# Patient Record
Sex: Male | Born: 1985 | Race: White | Hispanic: No | Marital: Single | Smoking: Current every day smoker
Health system: Southern US, Community
[De-identification: ages and names within clinical notes are randomized; demographics above are authoritative.]

---

## 2005-03-18 ENCOUNTER — Emergency Department (HOSPITAL_COMMUNITY): Admission: EM | Admit: 2005-03-18 | Discharge: 2005-03-18 | Payer: Self-pay | Admitting: *Deleted

## 2005-07-15 ENCOUNTER — Emergency Department (HOSPITAL_COMMUNITY): Admission: EM | Admit: 2005-07-15 | Discharge: 2005-07-15 | Payer: Self-pay | Admitting: Emergency Medicine

## 2005-09-08 ENCOUNTER — Emergency Department (HOSPITAL_COMMUNITY): Admission: EM | Admit: 2005-09-08 | Discharge: 2005-09-08 | Payer: Self-pay | Admitting: Emergency Medicine

## 2006-01-29 ENCOUNTER — Emergency Department (HOSPITAL_COMMUNITY): Admission: EM | Admit: 2006-01-29 | Discharge: 2006-01-29 | Payer: Self-pay | Admitting: Emergency Medicine

## 2006-03-23 ENCOUNTER — Emergency Department (HOSPITAL_COMMUNITY): Admission: EM | Admit: 2006-03-23 | Discharge: 2006-03-23 | Payer: Self-pay | Admitting: Family Medicine

## 2007-07-04 ENCOUNTER — Emergency Department (HOSPITAL_COMMUNITY): Admission: EM | Admit: 2007-07-04 | Discharge: 2007-07-04 | Payer: Self-pay | Admitting: Emergency Medicine

## 2008-01-27 ENCOUNTER — Emergency Department (HOSPITAL_COMMUNITY): Admission: EM | Admit: 2008-01-27 | Discharge: 2008-01-27 | Payer: Self-pay | Admitting: Emergency Medicine

## 2008-02-16 ENCOUNTER — Encounter: Admission: RE | Admit: 2008-02-16 | Discharge: 2008-02-16 | Payer: Self-pay | Admitting: Chiropractic Medicine
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
## 2011-08-23 LAB — I-STAT 8, (EC8 V) (CONVERTED LAB)
Acid-base deficit: 7 — ABNORMAL HIGH
Chloride: 108
HCT: 48
Operator id: 284251
Potassium: 4.3

## 2011-08-23 LAB — RAPID URINE DRUG SCREEN, HOSP PERFORMED
Barbiturates: NOT DETECTED
Benzodiazepines: NOT DETECTED
Cocaine: NOT DETECTED

## 2011-08-23 LAB — POCT I-STAT CREATININE: Creatinine, Ser: 1.4

## 2016-04-30 ENCOUNTER — Emergency Department
Admission: EM | Admit: 2016-04-30 | Discharge: 2016-04-30 | Disposition: A | Discharge status: Home / Self Care | Payer: Medicaid Other | Attending: Emergency Medicine | Admitting: Emergency Medicine

## 2016-04-30 ENCOUNTER — Encounter: Payer: Self-pay | Admitting: Emergency Medicine

## 2016-04-30 DIAGNOSIS — Y929 Unspecified place or not applicable: Secondary | ICD-10-CM | POA: Insufficient documentation

## 2016-04-30 DIAGNOSIS — T1501XA Foreign body in cornea, right eye, initial encounter: Secondary | ICD-10-CM | POA: Insufficient documentation

## 2016-04-30 DIAGNOSIS — X58XXXA Exposure to other specified factors, initial encounter: Secondary | ICD-10-CM | POA: Insufficient documentation

## 2016-04-30 DIAGNOSIS — Y999 Unspecified external cause status: Secondary | ICD-10-CM | POA: Diagnosis not present

## 2016-04-30 DIAGNOSIS — F17210 Nicotine dependence, cigarettes, uncomplicated: Secondary | ICD-10-CM | POA: Insufficient documentation

## 2016-04-30 DIAGNOSIS — Y9389 Activity, other specified: Secondary | ICD-10-CM | POA: Diagnosis not present

## 2016-04-30 DIAGNOSIS — T1591XA Foreign body on external eye, part unspecified, right eye, initial encounter: Secondary | ICD-10-CM

## 2016-04-30 DIAGNOSIS — H5711 Ocular pain, right eye: Secondary | ICD-10-CM | POA: Diagnosis present

## 2016-04-30 MED ORDER — EYE WASH OPHTH SOLN
OPHTHALMIC | Status: AC
  Filled 2016-04-30: qty 118

## 2016-04-30 MED ORDER — EYE WASH OPHTH SOLN: 1.0000 [drp] | OPHTHALMIC | Status: DC | PRN

## 2016-04-30 MED ORDER — FLUORESCEIN SODIUM 1 MG OP STRP
ORAL_STRIP | OPHTHALMIC | Status: AC
  Filled 2016-04-30: qty 1

## 2016-04-30 MED ORDER — TETANUS-DIPHTH-ACELL PERTUSSIS 5-2.5-18.5 LF-MCG/0.5 IM SUSP
0.5000 mL | Freq: Once | INTRAMUSCULAR | Status: AC
  Administered 2016-04-30: 0.5 mL via INTRAMUSCULAR
  Filled 2016-04-30: qty 0.5

## 2016-04-30 MED ORDER — TETRACAINE HCL 0.5 % OP SOLN: 1.0000 [drp] | Freq: Once | OPHTHALMIC | Status: DC

## 2016-04-30 MED ORDER — FLUORESCEIN SODIUM 1 MG OP STRP: 1.0000 | ORAL_STRIP | Freq: Once | OPHTHALMIC | Status: DC

## 2016-04-30 MED ORDER — TETRACAINE HCL 0.5 % OP SOLN
OPHTHALMIC | Status: AC
  Filled 2016-04-30: qty 2

## 2016-04-30 NOTE — Discharge Instructions (Signed)
Eye Foreign Body A foreign body is an object on or in the eye that should not be there. The object could be a speck of dirt or dust, a hair, an eyelash, a splinter, or any other object. HOME CARE  Take medicines only as told by your doctor. Use eye drops or ointment as told.  If no eye patch was put on:  Keep the eye closed as much as possible.  Do not rub the eye.  Wear dark glasses in bright light.  Do not wear contact lenses until the eye feels normal, or as told by your doctor.  Wear protective eye covering when needed, especially when using high-speed tools.  If your eye is patched:  Follow your doctor's instructions for when to remove the patch.  Do notdrive or use machines while the eye patch is on. Judging distances is hard to do while wearing a patch.  Keep all follow-up visits as told by your doctor. This is important. GET HELP IF:   Your pain gets worse.  Your vision gets worse.  You have problems with your eye patch.  You have fluid (discharge) coming from your eye.  You have redness and swelling around your eye. MAKE SURE YOU:   Understand these instructions.  Will watch your condition.  Will get help right away if you are not doing well or get worse.   This information is not intended to replace advice given to you by your health care provider. Make sure you discuss any questions you have with your health care provider.   Document Released: 04/17/2010 Document Revised: 11/18/2014 Document Reviewed: 03/25/2013 Elsevier Interactive Patient Education 2016 [employer]    Go immediately to [facility] for further treatment of your foreign body. You need to be in the office at 10:10 AM.

## 2016-04-30 NOTE — ED Provider Notes (Signed)
[facility] Emergency Department Provider Note   ____________________________________________  Time seen: Approximately 8:38 AM  I have reviewed the triage vital signs and the nursing notes.   HISTORY  Chief Complaint Eye Pain   HPI Paul Roy is a 30 y.o. male is here with complaint of medical foreign body in his right eye since chest today. Patient states that he was cutting the edge of a metal roof when he felt something go in his eye. He states he was wearing his safety glasses at the time. Since that time he has had eye pain and some redness. Patient states that both his father and his son have been using Q-tips to try and remove the metal piece from his eye. Patient is unaware if he has had a tetanus booster in the last 10 years or not.Currently he rates his pain as a 3/10.   History reviewed. No pertinent past medical history.  There are no active problems to display for this patient.   History reviewed. No pertinent past surgical history.  No current outpatient prescriptions on file.  Allergies Review of patient's allergies indicates no known allergies.  No family history on file.  Social History Social History  Substance Use Topics  . Smoking status: Current Every Day Smoker -- 1.00 packs/day    Types: Cigarettes  . Smokeless tobacco: None  . Alcohol Use: Yes     Comment: occasionally    Review of Systems Constitutional: No fever/chills Eyes: No visual changes.Positive right eye pain. ENT: No trauma Cardiovascular: Denies chest pain. Respiratory: Denies shortness of breath. Skin: Negative for rash. Neurological: Negative for headaches, focal weakness or numbness.  10-point ROS otherwise negative.  ____________________________________________   PHYSICAL EXAM:  VITAL SIGNS: ED Triage Vitals  Enc Vitals Group     BP 04/30/16 0826 107/69 mmHg     Pulse Rate 04/30/16 0826 50     Resp 04/30/16 0826 18     Temp 04/30/16  0826 97.8 F (36.6 C)     Temp Source 04/30/16 0826 Oral     SpO2 04/30/16 0826 98 %     Weight 04/30/16 0826 165 lb (74.844 kg)     Height 04/30/16 0826 5\' 11"  (1.803 m)     Head Cir --      Peak Flow --      Pain Score 04/30/16 0827 3     Pain Loc --      Pain Edu? --      Excl. in GC? --     Constitutional: Alert and oriented. Well appearing and in no acute distress. Eyes: Right eye is injected. There is a visible metallic foreign body noted without magnification seen at approximately 9:00 on the cornea. PERRL. EOMI. Head: Atraumatic. Nose: No congestion/rhinnorhea. Neck: No stridor.   Cardiovascular: Normal rate, regular rhythm. Grossly normal heart sounds.  Good peripheral circulation. Respiratory: Normal respiratory effort.  No retractions. Lungs CTAB. Gastrointestinal: Soft and nontender. No distention.  Musculoskeletal: Moves upper and lower extremities without any difficulty. Normal gait was noted. Neurologic:  Normal speech and language. No gross focal neurologic deficits are appreciated. No gait instability. Skin:  Skin is warm, dry and intact. No rash noted. Psychiatric: Mood and affect are normal. Speech and behavior are normal.  ____________________________________________   LABS (all labs ordered are listed, but only abnormal results are displayed)  Labs Reviewed - No data to display  PROCEDURES  Procedure(s) performed: Tetracaine was placed in the eye. Lid was inverted  without any foreign body noted. It was a metallic appearing foreign body present at the 9:00 position of the cornea. With a wet Q-tip foreign body was partially removed without any difficulty. Multiple attempts were made to remove the remaining portion without success. fluorescein uptake was positive in this area along with visualization of the metallic foreign body.  Critical Care performed: No  ____________________________________________   INITIAL IMPRESSION / ASSESSMENT AND PLAN / ED  COURSE  Pertinent labs & imaging results that were available during my care of the patient were reviewed by me and considered in my medical decision making (see chart for details).  Patient was given a tetanus booster. He is to leave the emergency room and go straight to [facility] for removal of the remaining foreign body. ____________________________________________   FINAL CLINICAL IMPRESSION(S) / ED DIAGNOSES  Final diagnoses:  Foreign body of right eye, initial encounter      NEW MEDICATIONS STARTED DURING THIS VISIT:  There are no discharge medications for this patient.    Note:  This document was prepared using Dragon voice recognition software and may include unintentional dictation errors.    Tommi Rumps, PA-C 04/30/16 1043

## 2016-04-30 NOTE — ED Notes (Signed)
States he thinks he got a piece of metal in right yesterday  conts to have pain denies any visula changes

## 2016-04-30 NOTE — ED Notes (Signed)
Patient presents to the ED with right eye pain.  Patient states, "I think there's a piece of metal in my eye." Patient states he was cutting the edge of a metal roof when his eye began to hurt.  Patient's right eye is slightly red.  Patient reports slight blurry vision.

## 2016-07-15 ENCOUNTER — Emergency Department
Admission: EM | Admit: 2016-07-15 | Discharge: 2016-07-15 | Disposition: A | Discharge status: Home / Self Care | Payer: Medicaid Other | Attending: Emergency Medicine | Admitting: Emergency Medicine

## 2016-07-15 ENCOUNTER — Other Ambulatory Visit
Admission: EM | Admit: 2016-07-15 | Discharge: 2016-07-15 | Disposition: A | Discharge status: Home / Self Care | Attending: Family Medicine | Admitting: Family Medicine

## 2016-07-15 DIAGNOSIS — E860 Dehydration: Secondary | ICD-10-CM | POA: Diagnosis not present

## 2016-07-15 DIAGNOSIS — F17210 Nicotine dependence, cigarettes, uncomplicated: Secondary | ICD-10-CM | POA: Insufficient documentation

## 2016-07-15 DIAGNOSIS — T40601A Poisoning by unspecified narcotics, accidental (unintentional), initial encounter: Secondary | ICD-10-CM | POA: Insufficient documentation

## 2016-07-15 LAB — BASIC METABOLIC PANEL
Anion gap: 4 — ABNORMAL LOW (ref 5–15)
BUN: 15 mg/dL (ref 6–20)
CALCIUM: 8.7 mg/dL — AB (ref 8.9–10.3)
CO2: 27 mmol/L (ref 22–32)
Chloride: 111 mmol/L (ref 101–111)
Creatinine, Ser: 1 mg/dL (ref 0.61–1.24)
GFR calc Af Amer: >60 mL/min (ref >60)
GLUCOSE: 155 mg/dL — AB (ref 65–99)
POTASSIUM: 3.3 mmol/L — AB (ref 3.5–5.1)
Sodium: 142 mmol/L (ref 135–145)

## 2016-07-15 LAB — URINE DRUG SCREEN, QUALITATIVE (ARMC ONLY)
Amphetamines, Ur Screen: NOT DETECTED
BARBITURATES, UR SCREEN: NOT DETECTED
BENZODIAZEPINE, UR SCRN: POSITIVE — AB
Cannabinoid 50 Ng, Ur ~~LOC~~: POSITIVE — AB
Cocaine Metabolite,Ur ~~LOC~~: NOT DETECTED
MDMA (Ecstasy)Ur Screen: NOT DETECTED
Methadone Scn, Ur: NOT DETECTED
OPIATE, UR SCREEN: NOT DETECTED
PHENCYCLIDINE (PCP) UR S: NOT DETECTED
Tricyclic, Ur Screen: NOT DETECTED

## 2016-07-15 LAB — CBC
HCT: 39.2 % — ABNORMAL LOW (ref 40.0–52.0)
Hemoglobin: 13.4 g/dL (ref 13.0–18.0)
MCH: 31.7 pg (ref 26.0–34.0)
MCHC: 34.2 g/dL (ref 32.0–36.0)
MCV: 92.5 fL (ref 80.0–100.0)
PLATELETS: 175 10*3/uL (ref 150–440)
RBC: 4.24 MIL/uL — ABNORMAL LOW (ref 4.40–5.90)
RDW: 12.1 % (ref 11.5–14.5)
WBC: 10.8 10*3/uL — ABNORMAL HIGH (ref 3.8–10.6)

## 2016-07-15 LAB — ACETAMINOPHEN LEVEL: Acetaminophen (Tylenol), Serum: <10 ug/mL — ABNORMAL LOW (ref 10–30)

## 2016-07-15 LAB — SALICYLATE LEVEL: Salicylate Lvl: <4 mg/dL (ref 2.8–30.0)

## 2016-07-15 MED ORDER — NALOXONE HCL 0.4 MG/0.4ML IJ SOAJ: 1.0000 "application " | Freq: Once | INTRAMUSCULAR | 0 refills | Status: AC

## 2016-07-15 NOTE — ED Notes (Signed)
Pt presents to ED in in custody of [employer] [employer] x2 officers for labs draws. Tourniquet applied to left arm, locating the vein in left antecubital. Area swab with porvidone-idine prep pad, then 22G needle used to draw blood into 2 tubes provided by the officer. No complication noted. Pt tolerated well.
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
## 2016-07-15 NOTE — ED Notes (Signed)
MD came to speak with pt and came out of room stating that pt was signing out AMA - pt is A&O x3 and responsive - pinpoint pupils - respirations even/unlabored - released with mother
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
## 2016-07-15 NOTE — Discharge Instructions (Signed)
You have been seen in the Emergency Department (ED) today for substance abuse and an accidental overdose of Percocet (oxycodone), which you took.    Please return to the ED immediately if you have ANY thoughts of hurting yourself or anyone else, so that we may help you.  Please avoid alcohol and drug use. DO NOT USE medications or any drugs not prescribed to you. This can be DEADLY.  Follow up with your doctor and/or therapist as soon as possible regarding today's ED  visit.   Please follow up any other recommendations and clinic appointments provided by the psychiatry team that saw you in the Emergency Department.
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
## 2016-08-11 DEATH — deceased
